# Patient Record
Sex: Male | Born: 2000
Health system: Southern US, Community
[De-identification: ages and names within clinical notes are randomized; demographics above are authoritative.]

## PROBLEM LIST (undated history)

## (undated) DIAGNOSIS — T7840XA Allergy, unspecified, initial encounter: Secondary | ICD-10-CM

## (undated) DIAGNOSIS — J45909 Unspecified asthma, uncomplicated: Secondary | ICD-10-CM

## (undated) HISTORY — PX: TONSILECTOMY, ADENOIDECTOMY, BILATERAL MYRINGOTOMY AND TUBES: SHX2538

## (undated) HISTORY — PX: ADENOIDECTOMY: SUR15

## (undated) HISTORY — DX: Unspecified asthma, uncomplicated: J45.909

## (undated) HISTORY — DX: Allergy, unspecified, initial encounter: T78.40XA

---
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
## 2019-02-14 ENCOUNTER — Encounter: Payer: Self-pay | Admitting: Family Medicine

## 2019-02-15 ENCOUNTER — Encounter: Payer: Self-pay | Admitting: Family Medicine

## 2019-03-27 ENCOUNTER — Ambulatory Visit: Payer: 59 | Admitting: Family Medicine

## 2019-03-27 ENCOUNTER — Other Ambulatory Visit: Payer: Self-pay

## 2019-03-27 VITALS — BP 110/62 | Temp 98.2°F | Ht 77.5 in | Wt 201.2 lb

## 2019-03-27 DIAGNOSIS — S76011A Strain of muscle, fascia and tendon of right hip, initial encounter: Secondary | ICD-10-CM

## 2019-03-27 DIAGNOSIS — M25551 Pain in right hip: Secondary | ICD-10-CM | POA: Diagnosis not present

## 2019-03-27 MED ORDER — NABUMETONE 750 MG PO TABS: 750.0000 mg | ORAL_TABLET | Freq: Two times a day (BID) | ORAL | 0 refills | Status: DC | PRN

## 2019-03-27 NOTE — Progress Notes (Signed)
   Subjective:    Patient ID: Preston Mills, male    DOB: [birth year], 19 y.o.   MRN: [id number]  Hip Pain  Pain location: right hip. Quality: sharp pain. He has tried nothing for the symptoms.   Patient playing baseball.  Pivoted suddenly while pitching.  Had a distinct pain in his right lateral hip.  Now aching and hurting.  To take it at times.  Worse with movement or motion.  No radiation to the knee or below.   Review of Systems See above    Objective:   Physical Exam  Alert vitals stable, NAD. Blood pressure good on repeat. HEENT normal. Lungs clear. Heart regular rate and rhythm. Hip good range of motion positive pain with lateral rotation and internal rotation.  Some point tenderness below the lateral trochanteric knee negative sciatic notch tenderness negative straight leg raise      Assessment & Plan:  Impression ligament strain lateral hip.  Anti-inflammatory medicine prescribed.  Exercises discussed and encouraged.  No x-rays rationale discussed

## 2019-06-05 DIAGNOSIS — Z20828 Contact with and (suspected) exposure to other viral communicable diseases: Secondary | ICD-10-CM | POA: Diagnosis not present

## 2019-06-11 ENCOUNTER — Other Ambulatory Visit: Payer: Self-pay

## 2019-06-11 ENCOUNTER — Encounter: Payer: Self-pay | Admitting: Family Medicine

## 2019-06-11 ENCOUNTER — Ambulatory Visit (INDEPENDENT_AMBULATORY_CARE_PROVIDER_SITE_OTHER): Payer: 59 | Admitting: Family Medicine

## 2019-06-11 VITALS — BP 126/70 | Temp 97.5°F | Ht 77.5 in | Wt 202.0 lb

## 2019-06-11 DIAGNOSIS — Z0000 Encounter for general adult medical examination without abnormal findings: Secondary | ICD-10-CM | POA: Diagnosis not present

## 2019-06-11 NOTE — Progress Notes (Signed)
   Subjective:    Patient ID: Preston Mills, male    DOB: August 13, 2000, 19 y.o.   MRN: [id number]  HPI   The patient comes in today for a wellness visit.    A review of their health history was completed.  A review of medications was also completed.  Any needed refills; none  Eating habits: pretty good  Falls/  MVA accidents in past few months: none  Regular exercise: 4 x per week   Specialist pt sees on regular basis: none  Preventative health issues were discussed.   Additional concerns: none  Exercising reg  Overall diet is  Review of Systems No headache, no major weight loss or weight gain, no chest pain no back pain abdominal pain no change in bowel habits complete ROS otherwise negative     Objective:   Physical Exam Vitals reviewed.  Constitutional:      Appearance: He is well-developed.  HENT:     Head: Normocephalic and atraumatic.     Right Ear: External ear normal.     Left Ear: External ear normal.     Nose: Nose normal.  Eyes:     Pupils: Pupils are equal, round, and reactive to light.  Neck:     Thyroid: No thyromegaly.  Cardiovascular:     Rate and Rhythm: Normal rate and regular rhythm.     Heart sounds: Normal heart sounds. No murmur.  Pulmonary:     Effort: Pulmonary effort is normal. No respiratory distress.     Breath sounds: Normal breath sounds. No wheezing.  Abdominal:     General: Bowel sounds are normal. There is no distension.     Palpations: Abdomen is soft. There is no mass.     Tenderness: There is no abdominal tenderness.  Genitourinary:    Penis: Normal.   Musculoskeletal:        General: Normal range of motion.     Cervical back: Normal range of motion and neck supple.  Lymphadenopathy:     Cervical: No cervical adenopathy.  Skin:    General: Skin is warm and dry.     Findings: No erythema.  Neurological:     Mental Status: He is alert.     Motor: No abnormal muscle tone.  Psychiatric:        Behavior: Behavior  normal.        Judgment: Judgment normal.           Assessment & Plan:  Impression wellness exam. Diet discussed. Exercise discussed. 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 Covid vaccines discussed and recommended.

## 2019-06-13 DIAGNOSIS — Z20828 Contact with and (suspected) exposure to other viral communicable diseases: Secondary | ICD-10-CM | POA: Diagnosis not present

## 2019-07-11 ENCOUNTER — Ambulatory Visit: Payer: 59

## 2019-07-11 ENCOUNTER — Other Ambulatory Visit: Payer: Self-pay

## 2019-07-11 ENCOUNTER — Ambulatory Visit (INDEPENDENT_AMBULATORY_CARE_PROVIDER_SITE_OTHER): Payer: 59

## 2019-07-11 ENCOUNTER — Ambulatory Visit
Admission: EM | Admit: 2019-07-11 | Discharge: 2019-07-11 | Disposition: A | Discharge status: Home / Self Care | Payer: 59 | Attending: Emergency Medicine | Admitting: Emergency Medicine

## 2019-07-11 DIAGNOSIS — S6991XA Unspecified injury of right wrist, hand and finger(s), initial encounter: Secondary | ICD-10-CM | POA: Diagnosis not present

## 2019-07-11 DIAGNOSIS — S62316A Displaced fracture of base of fifth metacarpal bone, right hand, initial encounter for closed fracture: Secondary | ICD-10-CM | POA: Diagnosis not present

## 2019-07-11 DIAGNOSIS — M79641 Pain in right hand: Secondary | ICD-10-CM | POA: Diagnosis not present

## 2019-07-11 DIAGNOSIS — S62308A Unspecified fracture of other metacarpal bone, initial encounter for closed fracture: Secondary | ICD-10-CM | POA: Diagnosis not present

## 2019-07-11 NOTE — Discharge Instructions (Addendum)
Take OTC Tylenol/ibuprofen as needed for pain Follow RICE instructions as attached Follow-up with orthopedic Return or go to ED for worsening symptoms

## 2019-07-11 NOTE — ED Provider Notes (Signed)
[facility]   [id number] 07/11/19 Arrival Time: 1916   Chief Complaint  Patient presents with  . Hand Injury     SUBJECTIVE: History from: patient.  Preston Mills is a 19 y.o. male who presents to the urgent care for complaint of right hand pain for the past 1 to 2 hours.  Reported he got mad and punched the wall.  He localizes the pain to the right hand.  He describes the pain as constant and achy.  He has tried OTC medications without relief.  His symptoms are made worse with ROM.  He denies similar symptoms in the past.  Denies dysuria, fever, nausea, vomiting, diarrhea   ROS: As per HPI.  All other pertinent ROS negative.     Past Medical History:  Diagnosis Date  . Allergy   . Asthma    Past Surgical History:  Procedure Laterality Date  . ADENOIDECTOMY  [id number]  . TONSILECTOMY, ADENOIDECTOMY, BILATERAL MYRINGOTOMY AND TUBES  [id number] and 11/27/2004  . TYMPANOSTOMY TUBE PLACEMENT  05/2000   No Known Allergies No current facility-administered medications on file prior to encounter.   No current outpatient medications on file prior to encounter.   Social History   Socioeconomic History  . Marital status: Single    Spouse name: Not on file  . Number of children: Not on file  . Years of education: Not on file  . Highest education level: Not on file  Occupational History  . Not on file  Tobacco Use  . Smoking status: Never Smoker  . Smokeless tobacco: Current User    Types: Snuff  Substance and Sexual Activity  . Alcohol use: Never  . Drug use: Never  . Sexual activity: Not on file  Other Topics Concern  . Not on file  Social History Narrative  . Not on file   Social Determinants of Health   Financial Resource Strain:   . Difficulty of Paying Living Expenses:   Food Insecurity:   . Worried About Charity fundraiser in the Last Year:   . Arboriculturist in the Last Year:   Transportation Needs:   . Film/video editor (Medical):   Marland Kitchen  Lack of Transportation (Non-Medical):   Physical Activity:   . Days of Exercise per Week:   . Minutes of Exercise per Session:   Stress:   . Feeling of Stress :   Social Connections:   . Frequency of Communication with Friends and Family:   . Frequency of Social Gatherings with Friends and Family:   . Attends Religious Services:   . Active Member of Clubs or Organizations:   . Attends Archivist Meetings:   Marland Kitchen Marital Status:   Intimate Partner Violence:   . Fear of Current or Ex-Partner:   . Emotionally Abused:   Marland Kitchen Physically Abused:   . Sexually Abused:    Family History  Problem Relation Age of Onset  . Cancer Maternal Grandmother        breast and thyroid  . Diabetes Maternal Grandmother   . Kidney disease Maternal Grandfather        kidney stones    OBJECTIVE:  Vitals:   07/11/19 1926  BP: (!) 147/71  Pulse: 65  Resp: 16  Temp: 98.3 F (36.8 C)  TempSrc: Oral  SpO2: 98%     Physical Exam Vitals and nursing note reviewed.  Constitutional:      General: He is not in acute distress.  Appearance: Normal appearance. He is normal weight. He is not ill-appearing, toxic-appearing or diaphoretic.  Cardiovascular:     Rate and Rhythm: Normal rate and regular rhythm.     Pulses: Normal pulses.     Heart sounds: Normal heart sounds. No murmur heard.  No friction rub. No gallop.   Pulmonary:     Effort: Pulmonary effort is normal. No respiratory distress.     Breath sounds: Normal breath sounds. No stridor. No wheezing, rhonchi or rales.  Chest:     Chest wall: No tenderness.  Musculoskeletal:        General: Tenderness present.     Right hand: Swelling and tenderness present.     Left hand: Normal.     Comments: The right hand is with obvious deformity when compared to the left hand.  Swelling present.  No erythema, atrophy, surface trauma, open wound, nail avulsion, tissue avulsion, partial or complete amputation, subungual hematoma, bony deformity.   Normal finger cascade. Partial  range of motion due to pain.  Neurovascular status intact.  Neurological:     Mental Status: He is alert and oriented to person, place, and time.     Cranial Nerves: No cranial nerve deficit.     Sensory: No sensory deficit.     LABS:  No results found for this or any previous visit (from the past 24 hour(s)).   ASSESSMENT & PLAN:  1. Right hand pain   2. Closed fracture of fifth metacarpal bone, initial encounter     No orders of the defined types were placed in this encounter.  Patient is stable at discharge.  Right hand x-ray is positive for acute fracture of the fifth metacarpal.  I have reviewed the x-ray myself and the radiologist interpretation.  I am in agreement with the radiologist interpretation.   Discharge instructions  Take OTC Tylenol/ibuprofen as needed for pain Follow RICE instructions as attached Follow-up with orthopedic Return or go to ED for worsening symptoms   Reviewed expectations re: course of current medical issues. Questions answered. Outlined signs and symptoms indicating need for more acute intervention. Patient verbalized understanding. After Visit Summary given.      Note: This document was prepared using Dragon voice recognition software and may include unintentional dictation errors.    Durward Parcel, FNP 07/11/19 2008

## 2019-07-11 NOTE — ED Triage Notes (Signed)
Punched wall and injured right hand

## 2019-07-13 DIAGNOSIS — S62336A Displaced fracture of neck of fifth metacarpal bone, right hand, initial encounter for closed fracture: Secondary | ICD-10-CM | POA: Diagnosis not present

## 2019-07-13 DIAGNOSIS — M25641 Stiffness of right hand, not elsewhere classified: Secondary | ICD-10-CM | POA: Diagnosis not present

## 2019-07-24 DIAGNOSIS — S62336A Displaced fracture of neck of fifth metacarpal bone, right hand, initial encounter for closed fracture: Secondary | ICD-10-CM | POA: Diagnosis not present

## 2019-08-07 DIAGNOSIS — S62346D Nondisplaced fracture of base of fifth metacarpal bone, right hand, subsequent encounter for fracture with routine healing: Secondary | ICD-10-CM | POA: Diagnosis not present

## 2019-08-28 DIAGNOSIS — S62346D Nondisplaced fracture of base of fifth metacarpal bone, right hand, subsequent encounter for fracture with routine healing: Secondary | ICD-10-CM | POA: Diagnosis not present

## 2019-09-18 DIAGNOSIS — Z23 Encounter for immunization: Secondary | ICD-10-CM | POA: Diagnosis not present

## 2019-10-24 ENCOUNTER — Ambulatory Visit: Payer: 59 | Admitting: Family Medicine

## 2020-02-11 ENCOUNTER — Other Ambulatory Visit: Payer: Self-pay

## 2020-02-11 ENCOUNTER — Ambulatory Visit (INDEPENDENT_AMBULATORY_CARE_PROVIDER_SITE_OTHER): Payer: 59 | Admitting: Family Medicine

## 2020-02-11 VITALS — BP 122/82 | Ht 77.0 in | Wt 212.4 lb

## 2020-02-11 DIAGNOSIS — J302 Other seasonal allergic rhinitis: Secondary | ICD-10-CM

## 2020-02-11 NOTE — Progress Notes (Signed)
   Subjective:    Patient ID: Preston Mills, male    DOB: Jun 04, 2000, 19 y.o.   MRN: [id number]  HPI Patient arrives to have forms filled out for baseball. No problems or concerns. The patient comes in today for a wellness visit.    A review of their health history was completed.  A review of medications was also completed.  Any needed refills; none  Eating habits: Overall good  Falls/  MVA accidents in past few months: No injuries recently  Regular exercise: Exercises regularly and plays college baseball  Specialist pt sees on regular basis: None  Preventative health issues were discussed.   Additional concerns: None Patient does have some seasonal allergies with runny nose sneezing during the change of seasons no inhaler necessary He relates typically take Claritin and it will help tries avoid excessive pollen exposure Review of Systems  Constitutional: Negative for activity change.  HENT: Negative for congestion and rhinorrhea.   Respiratory: Negative for cough and shortness of breath.   Cardiovascular: Negative for chest pain.  Gastrointestinal: Negative for abdominal pain, diarrhea, nausea and vomiting.  Genitourinary: Negative for dysuria and hematuria.  Neurological: Negative for weakness and headaches.  Psychiatric/Behavioral: Negative for behavioral problems and confusion.       Objective:   Physical Exam Vitals reviewed.  Constitutional:      Appearance: He is well-nourished.  Cardiovascular:     Rate and Rhythm: Normal rate and regular rhythm.     Heart sounds: Normal heart sounds. No murmur heard.   Pulmonary:     Effort: Pulmonary effort is normal.     Breath sounds: Normal breath sounds.  Musculoskeletal:        General: No edema.  Lymphadenopathy:     Cervical: No cervical adenopathy.  Neurological:     Mental Status: He is alert.  Psychiatric:        Behavior: Behavior normal.    Patient flexible no hyperextension noted knees ankles  shoulders normal No murmurs with squatting and standing No hernias GU normal  Patient not depressed does not smoke does not drink tries to be safe with driving  Orthopedically good range of motion good knees are good     Assessment & Plan:  This young patient was seen today for a wellness exam. Significant time was spent discussing the following items: -Developmental status for age was reviewed. -School habits-including study habits -Safety measures appropriate for age were discussed. -Review of immunizations was completed. The appropriate immunizations were discussed and ordered. -Dietary recommendations and physical activity recommendations were made. -Gen. health recommendations including avoidance of substance use such as alcohol and tobacco were discussed -Sexuality issues in the appropriate age group was discussed -Discussion of growth parameters were also made with the family. -Questions regarding general health that the patient and family were answered. Up-to-date on immunizations  Healthy eating recommended regular physical activity with baseball Very good health Approved for sports  Seasonal allergies under good control with OTC Claritin

## 2020-04-02 ENCOUNTER — Encounter: Payer: Self-pay | Admitting: Family Medicine

## 2020-04-02 ENCOUNTER — Ambulatory Visit (INDEPENDENT_AMBULATORY_CARE_PROVIDER_SITE_OTHER): Payer: 59 | Admitting: Family Medicine

## 2020-04-02 ENCOUNTER — Other Ambulatory Visit: Payer: Self-pay

## 2020-04-02 VITALS — BP 122/70 | Temp 97.9°F | Ht 77.0 in | Wt 218.0 lb

## 2020-04-02 DIAGNOSIS — R10814 Left lower quadrant abdominal tenderness: Secondary | ICD-10-CM | POA: Diagnosis not present

## 2020-04-02 DIAGNOSIS — T148XXA Other injury of unspecified body region, initial encounter: Secondary | ICD-10-CM

## 2020-04-02 MED ORDER — DICLOFENAC SODIUM 75 MG PO TBEC: 75.0000 mg | DELAYED_RELEASE_TABLET | Freq: Two times a day (BID) | ORAL | 0 refills | Status: AC

## 2020-04-02 NOTE — Progress Notes (Signed)
   Subjective:    Patient ID: Preston Mills, male    DOB: 2000-08-05, 19 y.o.   MRN: [id number]  HPIpain on left side for 5 days.  Left side discomfort over the past several days.  States he felt this first when he was pitching.  Felt like a tightening.  Pain radiated up and down the side.  There was no rash no blistering no fevers.  No vomiting diarrhea no fever chills no dysuria.  PMH benign   Review of Systems Please see above    Objective:   Physical Exam  Lungs clear heart regular abdomen is soft no guarding or rebound no hernia detected.  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 Ayesha Mohair.

## 2020-04-14 ENCOUNTER — Ambulatory Visit: Payer: 59 | Admitting: Family Medicine

## 2020-09-15 DIAGNOSIS — Z20822 Contact with and (suspected) exposure to covid-19: Secondary | ICD-10-CM | POA: Diagnosis not present

## 2021-05-29 DIAGNOSIS — A09 Infectious gastroenteritis and colitis, unspecified: Secondary | ICD-10-CM | POA: Diagnosis not present

## 2021-05-29 DIAGNOSIS — Z20828 Contact with and (suspected) exposure to other viral communicable diseases: Secondary | ICD-10-CM | POA: Diagnosis not present

## 2021-07-17 IMAGING — DX DG HAND COMPLETE 3+V*R*
3 series · 3 of 3 positions shown · non-contrast
Comparison: Right hand radiograph dated 11/05/2013.

CLINICAL DATA: 18-year-old male with trauma to the right hand.

EXAM:
RIGHT HAND - COMPLETE 3+ VIEW

[hand pa]
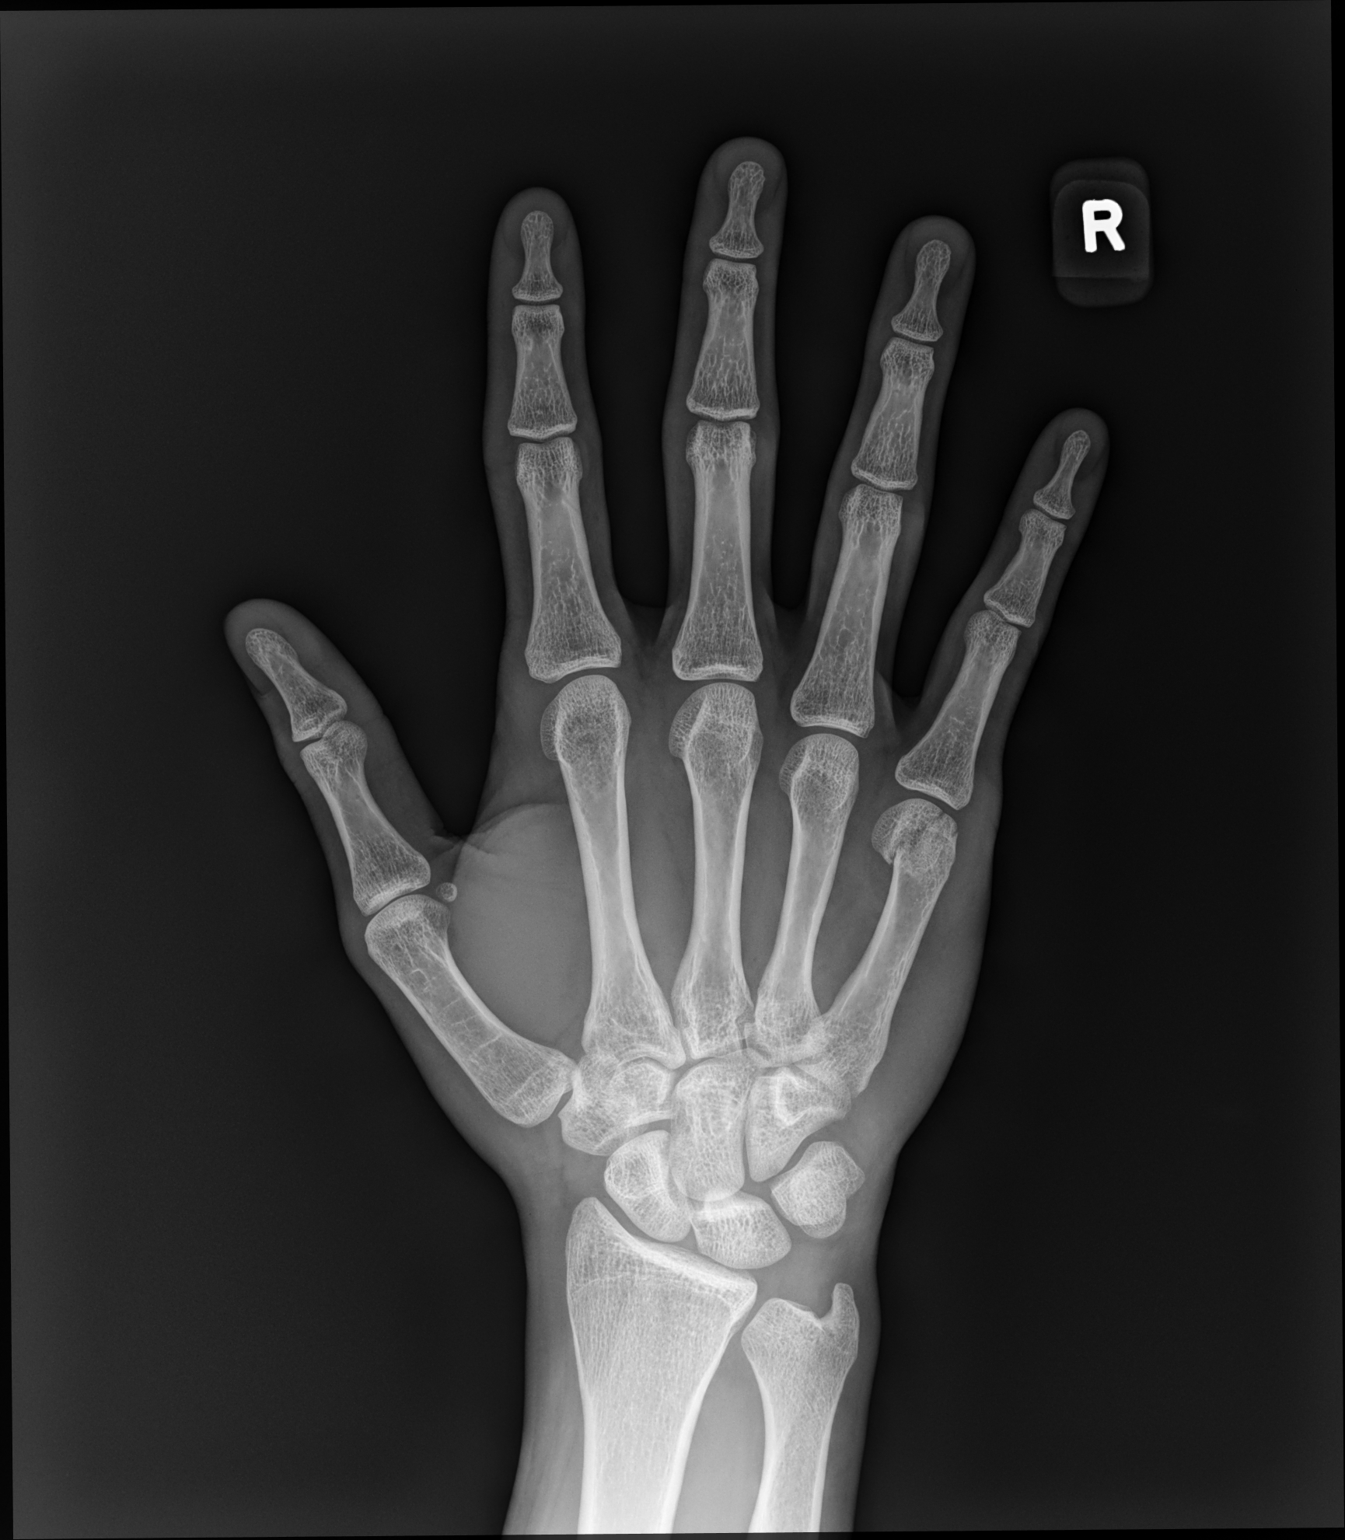

[hand mlo]
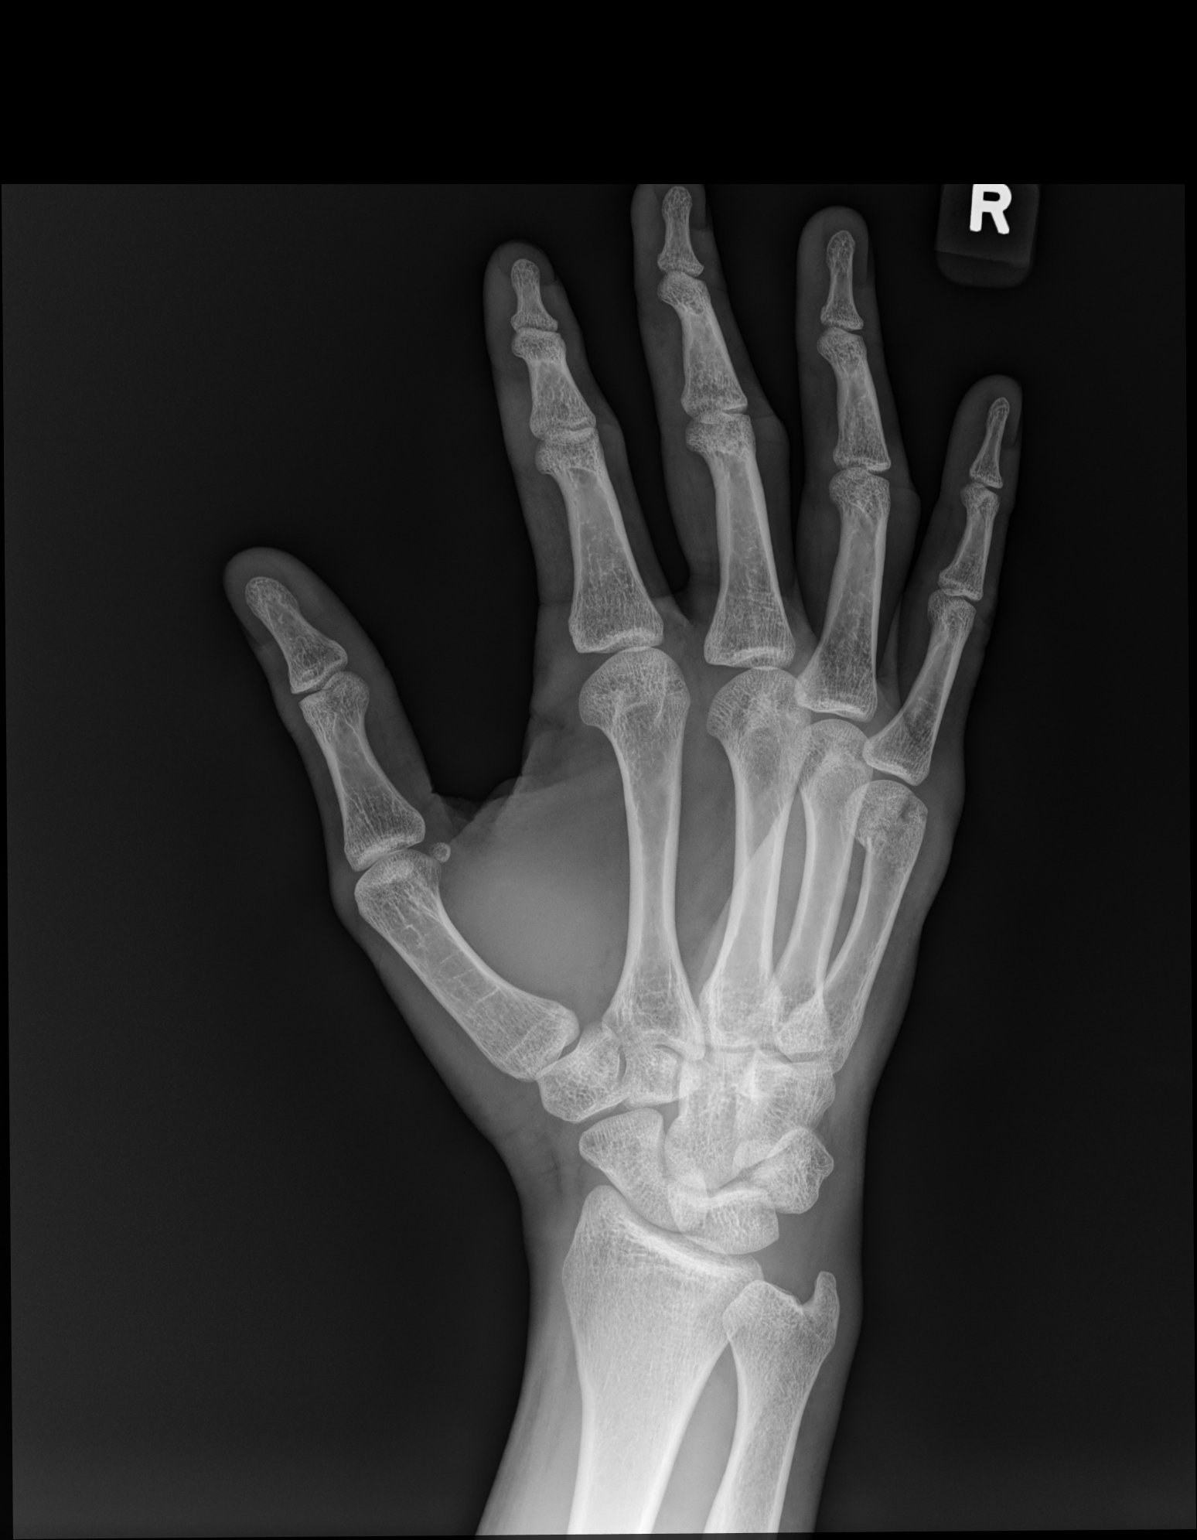

[hand lat]
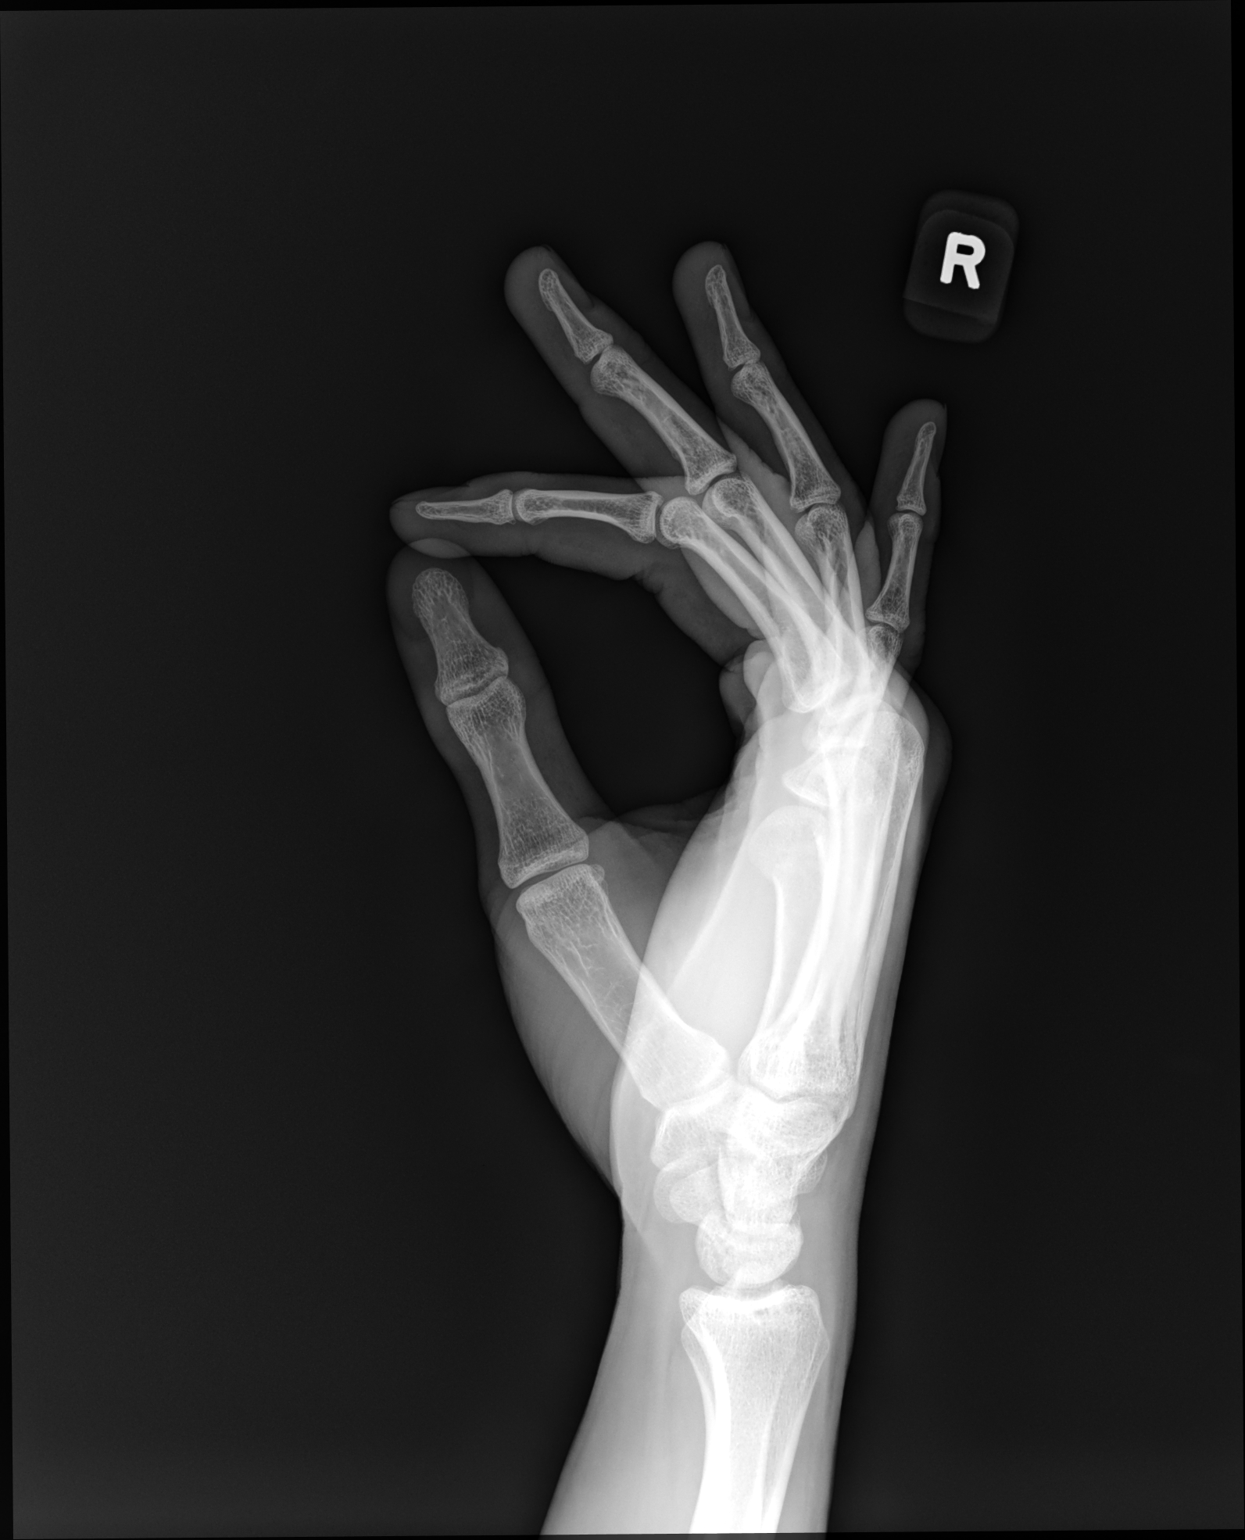

[3 of 3 positions shown; findings below may reference images not displayed]

FINDINGS: There is a minimally angulated fracture of the head of the fifth
metacarpal with mild volar angulation of the distal fracture
fragment. No dislocation. The bones are well mineralized. Mild soft
tissue swelling over the fifth MCP joint. No radiopaque foreign
object or soft tissue gas.
IMPRESSION: Minimally angulated fracture of the head of the fifth metacarpal.

## 2021-10-16 ENCOUNTER — Other Ambulatory Visit: Payer: Self-pay | Admitting: Family Medicine

## 2021-10-16 ENCOUNTER — Encounter: Payer: Self-pay | Admitting: Family Medicine

## 2021-10-16 DIAGNOSIS — M62838 Other muscle spasm: Secondary | ICD-10-CM | POA: Diagnosis not present

## 2021-10-16 DIAGNOSIS — R079 Chest pain, unspecified: Secondary | ICD-10-CM | POA: Diagnosis not present

## 2021-10-16 DIAGNOSIS — S29011A Strain of muscle and tendon of front wall of thorax, initial encounter: Secondary | ICD-10-CM | POA: Diagnosis not present

## 2021-10-16 MED ORDER — CHLORZOXAZONE 500 MG PO TABS: ORAL_TABLET | ORAL | 0 refills | Status: AC

## 2022-03-26 DIAGNOSIS — Z202 Contact with and (suspected) exposure to infections with a predominantly sexual mode of transmission: Secondary | ICD-10-CM | POA: Diagnosis not present
